# Patient Record
Sex: Male | Born: 1980 | Race: White | Hispanic: No | Smoking: Never smoker
Health system: Southern US, Community
[De-identification: ages and names within clinical notes are randomized; demographics above are authoritative.]

## PROBLEM LIST (undated history)

## (undated) DIAGNOSIS — G2581 Restless legs syndrome: Secondary | ICD-10-CM

## (undated) DIAGNOSIS — S069X9A Unspecified intracranial injury with loss of consciousness of unspecified duration, initial encounter: Secondary | ICD-10-CM

## (undated) DIAGNOSIS — J939 Pneumothorax, unspecified: Secondary | ICD-10-CM

## (undated) DIAGNOSIS — S36113A Laceration of liver, unspecified degree, initial encounter: Secondary | ICD-10-CM

## (undated) DIAGNOSIS — S069XAA Unspecified intracranial injury with loss of consciousness status unknown, initial encounter: Secondary | ICD-10-CM

## (undated) DIAGNOSIS — S36039A Unspecified laceration of spleen, initial encounter: Secondary | ICD-10-CM

## (undated) HISTORY — PX: LUNG REMOVAL, PARTIAL: SHX233

## (undated) HISTORY — PX: CLAVICLE SURGERY: SHX598

## (undated) HISTORY — PX: PLEURAL SCARIFICATION: SHX748

---
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
## 2004-08-01 ENCOUNTER — Emergency Department (HOSPITAL_COMMUNITY): Admission: EM | Admit: 2004-08-01 | Discharge: 2004-08-01 | Payer: Self-pay | Admitting: Family Medicine

## 2004-11-18 ENCOUNTER — Inpatient Hospital Stay (HOSPITAL_COMMUNITY): Admission: AC | Admit: 2004-11-18 | Discharge: 2004-11-21 | Payer: Self-pay

## 2005-07-15 ENCOUNTER — Inpatient Hospital Stay (HOSPITAL_COMMUNITY): Admission: AC | Admit: 2005-07-15 | Discharge: 2005-08-10 | Payer: Self-pay

## 2005-07-15 ENCOUNTER — Ambulatory Visit: Payer: Self-pay | Admitting: Physical Medicine & Rehabilitation

## 2005-08-02 ENCOUNTER — Encounter (INDEPENDENT_AMBULATORY_CARE_PROVIDER_SITE_OTHER): Payer: Self-pay | Admitting: Specialist

## 2005-08-02 ENCOUNTER — Encounter (INDEPENDENT_AMBULATORY_CARE_PROVIDER_SITE_OTHER): Payer: Self-pay | Admitting: *Deleted

## 2005-08-22 ENCOUNTER — Encounter: Admission: RE | Admit: 2005-08-22 | Discharge: 2005-08-22 | Payer: Self-pay | Admitting: Thoracic Surgery

## 2005-09-12 ENCOUNTER — Encounter: Admission: RE | Admit: 2005-09-12 | Discharge: 2005-09-12 | Payer: Self-pay | Admitting: Thoracic Surgery
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
## 2007-09-24 ENCOUNTER — Emergency Department (HOSPITAL_COMMUNITY): Admission: EM | Admit: 2007-09-24 | Discharge: 2007-09-24 | Payer: Self-pay | Admitting: Family Medicine

## 2007-09-27 ENCOUNTER — Emergency Department (HOSPITAL_COMMUNITY): Admission: EM | Admit: 2007-09-27 | Discharge: 2007-09-27 | Payer: Self-pay | Admitting: Emergency Medicine

## 2007-10-03 ENCOUNTER — Ambulatory Visit: Payer: Self-pay

## 2008-09-10 ENCOUNTER — Ambulatory Visit (HOSPITAL_COMMUNITY): Admission: RE | Admit: 2008-09-10 | Discharge: 2008-09-10 | Payer: Self-pay

## 2008-09-23 ENCOUNTER — Ambulatory Visit: Payer: Self-pay | Admitting: Cardiovascular Disease

## 2008-12-19 ENCOUNTER — Emergency Department (HOSPITAL_COMMUNITY): Admission: EM | Admit: 2008-12-19 | Discharge: 2008-12-19 | Payer: Self-pay | Admitting: Emergency Medicine

## 2009-02-05 ENCOUNTER — Emergency Department (HOSPITAL_COMMUNITY): Admission: EM | Admit: 2009-02-05 | Discharge: 2009-02-05 | Payer: Self-pay | Admitting: Family Medicine

## 2009-08-17 ENCOUNTER — Inpatient Hospital Stay (HOSPITAL_COMMUNITY): Admission: EM | Admit: 2009-08-17 | Discharge: 2009-08-28 | Payer: Self-pay | Admitting: Emergency Medicine

## 2009-08-25 ENCOUNTER — Ambulatory Visit: Payer: Self-pay | Admitting: Thoracic Surgery

## 2010-05-17 ENCOUNTER — Emergency Department (HOSPITAL_BASED_OUTPATIENT_CLINIC_OR_DEPARTMENT_OTHER): Admission: EM | Admit: 2010-05-17 | Discharge: 2010-05-17 | Payer: Self-pay | Admitting: Emergency Medicine

## 2010-05-17 ENCOUNTER — Ambulatory Visit: Payer: Self-pay | Admitting: Diagnostic Radiology

## 2011-03-16 LAB — DIFFERENTIAL
Basophils Absolute: 0 10*3/uL (ref 0.0–0.1)
Basophils Absolute: 0 10*3/uL (ref 0.0–0.1)
Basophils Absolute: 0 10*3/uL (ref 0.0–0.1)
Basophils Relative: 0 % (ref 0–1)
Basophils Relative: 0 % (ref 0–1)
Basophils Relative: 1 % (ref 0–1)
Basophils Relative: 1 % (ref 0–1)
Eosinophils Absolute: 0 10*3/uL (ref 0.0–0.7)
Eosinophils Absolute: 0 10*3/uL (ref 0.0–0.7)
Eosinophils Absolute: 0.1 10*3/uL (ref 0.0–0.7)
Eosinophils Absolute: 0.1 10*3/uL (ref 0.0–0.7)
Eosinophils Relative: 0 % (ref 0–5)
Eosinophils Relative: 0 % (ref 0–5)
Eosinophils Relative: 1 % (ref 0–5)
Eosinophils Relative: 1 % (ref 0–5)
Lymphocytes Relative: 12 % (ref 12–46)
Lymphocytes Relative: 16 % (ref 12–46)
Lymphocytes Relative: 3 % — ABNORMAL LOW (ref 12–46)
Lymphs Abs: 1.2 10*3/uL (ref 0.7–4.0)
Lymphs Abs: 1.7 10*3/uL (ref 0.7–4.0)
Monocytes Absolute: 1 10*3/uL (ref 0.1–1.0)
Monocytes Absolute: 1 10*3/uL (ref 0.1–1.0)
Monocytes Absolute: 1.3 10*3/uL — ABNORMAL HIGH (ref 0.1–1.0)
Monocytes Relative: 11 % (ref 3–12)
Monocytes Relative: 7 % (ref 3–12)
Monocytes Relative: 8 % (ref 3–12)
Monocytes Relative: 9 % (ref 3–12)
Neutro Abs: 10.6 10*3/uL — ABNORMAL HIGH (ref 1.7–7.7)
Neutro Abs: 8.5 10*3/uL — ABNORMAL HIGH (ref 1.7–7.7)
Neutrophils Relative %: 72 % (ref 43–77)
Neutrophils Relative %: 87 % — ABNORMAL HIGH (ref 43–77)
Neutrophils Relative %: 92 % — ABNORMAL HIGH (ref 43–77)

## 2011-03-16 LAB — CBC
HCT: 42.5 % (ref 39.0–52.0)
HCT: 44.8 % (ref 39.0–52.0)
HCT: 47.2 % (ref 39.0–52.0)
HCT: 48.7 % (ref 39.0–52.0)
HCT: 53.3 % — ABNORMAL HIGH (ref 39.0–52.0)
Hemoglobin: 14.1 g/dL (ref 13.0–17.0)
Hemoglobin: 14.2 g/dL (ref 13.0–17.0)
Hemoglobin: 17.7 g/dL — ABNORMAL HIGH (ref 13.0–17.0)
MCHC: 33.3 g/dL (ref 30.0–36.0)
MCHC: 33.4 g/dL (ref 30.0–36.0)
MCHC: 33.4 g/dL (ref 30.0–36.0)
MCHC: 33.9 g/dL (ref 30.0–36.0)
MCV: 86.4 fL (ref 78.0–100.0)
MCV: 86.5 fL (ref 78.0–100.0)
MCV: 86.8 fL (ref 78.0–100.0)
MCV: 87.1 fL (ref 78.0–100.0)
MCV: 87.5 fL (ref 78.0–100.0)
Platelets: 143 10*3/uL — ABNORMAL LOW (ref 150–400)
Platelets: 145 10*3/uL — ABNORMAL LOW (ref 150–400)
Platelets: 145 10*3/uL — ABNORMAL LOW (ref 150–400)
Platelets: 152 10*3/uL (ref 150–400)
Platelets: 160 10*3/uL (ref 150–400)
Platelets: 167 10*3/uL (ref 150–400)
Platelets: 179 10*3/uL (ref 150–400)
RBC: 4.82 MIL/uL (ref 4.22–5.81)
RBC: 5.18 MIL/uL (ref 4.22–5.81)
RBC: 5.39 MIL/uL (ref 4.22–5.81)
RBC: 6.15 MIL/uL — ABNORMAL HIGH (ref 4.22–5.81)
RDW: 15.7 % — ABNORMAL HIGH (ref 11.5–15.5)
RDW: 15.9 % — ABNORMAL HIGH (ref 11.5–15.5)
RDW: 15.9 % — ABNORMAL HIGH (ref 11.5–15.5)
RDW: 16.1 % — ABNORMAL HIGH (ref 11.5–15.5)
RDW: 16.1 % — ABNORMAL HIGH (ref 11.5–15.5)
WBC: 10.7 10*3/uL — ABNORMAL HIGH (ref 4.0–10.5)
WBC: 11.4 10*3/uL — ABNORMAL HIGH (ref 4.0–10.5)
WBC: 12.6 10*3/uL — ABNORMAL HIGH (ref 4.0–10.5)
WBC: 17.4 10*3/uL — ABNORMAL HIGH (ref 4.0–10.5)

## 2011-03-16 LAB — BASIC METABOLIC PANEL
BUN: 10 mg/dL (ref 6–23)
BUN: 5 mg/dL — ABNORMAL LOW (ref 6–23)
BUN: 8 mg/dL (ref 6–23)
Calcium: 8.8 mg/dL (ref 8.4–10.5)
Chloride: 99 mEq/L (ref 96–112)
Creatinine, Ser: 1.12 mg/dL (ref 0.4–1.5)
Creatinine, Ser: 1.25 mg/dL (ref 0.4–1.5)
GFR calc Af Amer: >60 mL/min (ref >60)
GFR calc non Af Amer: >60 mL/min (ref >60)
GFR calc non Af Amer: >60 mL/min (ref >60)
GFR calc non Af Amer: >60 mL/min (ref >60)
Glucose, Bld: 132 mg/dL — ABNORMAL HIGH (ref 70–99)
Glucose, Bld: 137 mg/dL — ABNORMAL HIGH (ref 70–99)
Potassium: 4.1 mEq/L (ref 3.5–5.1)
Potassium: 4.8 mEq/L (ref 3.5–5.1)
Sodium: 135 mEq/L (ref 135–145)

## 2011-03-16 LAB — POCT I-STAT, CHEM 8
BUN: 13 mg/dL (ref 6–23)
Chloride: 101 mEq/L (ref 96–112)
Creatinine, Ser: 1.3 mg/dL (ref 0.4–1.5)
Potassium: 3.4 mEq/L — ABNORMAL LOW (ref 3.5–5.1)
Sodium: 138 mEq/L (ref 135–145)
TCO2: 23 mmol/L (ref 0–100)

## 2011-03-16 LAB — POCT CARDIAC MARKERS
Myoglobin, poc: >500 ng/mL (ref 12–200)
Troponin i, poc: <0.05 ng/mL (ref 0.00–0.09)

## 2011-03-16 LAB — PROTIME-INR: INR: 1 (ref 0.00–1.49)

## 2011-03-19 ENCOUNTER — Emergency Department (HOSPITAL_BASED_OUTPATIENT_CLINIC_OR_DEPARTMENT_OTHER)
Admission: EM | Admit: 2011-03-19 | Discharge: 2011-03-19 | Disposition: A | Discharge status: Home / Self Care | Attending: Emergency Medicine | Admitting: Emergency Medicine

## 2011-03-19 DIAGNOSIS — J45909 Unspecified asthma, uncomplicated: Secondary | ICD-10-CM | POA: Insufficient documentation

## 2011-03-19 DIAGNOSIS — L0211 Cutaneous abscess of neck: Secondary | ICD-10-CM | POA: Insufficient documentation

## 2011-03-19 DIAGNOSIS — F341 Dysthymic disorder: Secondary | ICD-10-CM | POA: Insufficient documentation

## 2011-03-19 DIAGNOSIS — L723 Sebaceous cyst: Secondary | ICD-10-CM | POA: Insufficient documentation

## 2011-03-23 LAB — CULTURE, ROUTINE-ABSCESS: Culture: NO GROWTH
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
## 2011-04-27 NOTE — Op Note (Signed)
NAME:  [name], VESTER NO.:  [id number]   MEDICAL RECORD NO.:  [id number]          PATIENT TYPE:  INP   LOCATION:  3309                         FACILITY:  [facility]   PHYSICIAN:  Ines Bloomer, M.D. DATE OF BIRTH:  07-16-1981   DATE OF PROCEDURE:  08/02/2005  DATE OF DISCHARGE:                                 OPERATIVE REPORT   PREOPERATIVE DIAGNOSIS:  Status post rib fractures with right hemothorax and  multiple other fractures with probable empyema.   POSTOPERATIVE DIAGNOSES:  1.  Status post rib fractures with right hemothorax and multiple other      fractures with probable empyema.  2.  Empyema right chest secondary to hemothorax.   OPERATION PERFORMED:  1.  Right VATs.  2.  Right thoracotomy with decortication and drainage of empyema.   SURGEON:  Ines Bloomer, M.D.   FIRST ASSISTANT:  Pecola Leisure, PA.   ANESTHESIA:  General.   After percutaneous insertion of all monitoring lines, the patient turned to  the right lateral thoracotomy position, was prepped and draped in the usual  sterile manner.  Two trocar sites were made in the anterior.  A dual-lumen  tube was inserted, and two trocar sites were made in the anterior and  posterior axillary line.  At the 7th intercostal space, two trocars were  inserted.  Adhesions were taken down and a 0 degree scope was inserted, and  the patient had a full blown empyema.  Using Keyser ring forceps, the lung  was freed up medially, laterally, superiorly, and inferiorly removing all  kinds of exudate.  Sent in for culture as well as sending the fluid for  culture.  After this had been done, a third trocar site was made in the  sixth intercostal space at the mid axillary line and putting the scope up  there we were able to remove the exudate from the right costophrenic angle  and partially freeing up the right lower lobe.  Obviously, there was too  much exudate and scarring on the right lower lobe that we had  to do a mini  thoracotomy.  The sixth intercostal space incision was enlarged to  approximately 4 or 5 cm.  Lattismus was split, and a small Tuffier was  inserted, and the right lower lobe was then freed up by sharp and blunt  dissection removing the exudate on it.  The right middle lobe was also freed  up and then the anterior segment of the upper lobe was freed up.  The major  fissure was opened removing all exudate and fluid out of the  fissure.  Finally, attention was turned to the middle lobe and it was grasped with a  forceps and the peel was removed using Kitner's and Russian forceps as well  as DeBakey forceps.  After the middle lobe being decorticated, the anterior  segment of the upper lobe was likewise decorticated, and then attention was  turned to the lower lobe which had to do a complete decortication.  Decortication of the whole right lower lobe removing the thickened peel off  of that.  After that had been done, three chest tubes were placed; two  through the trocar sites, and one through another incision.  The chest was  closed completely debrided and the lung was re-expanded.  Several tears in  the lung were sutured with 3-0 Vicryl.  The On-  Q  catheter was placed subpleurally in the usual fashion.  Marcaine block  was done in the usual fashion.  The chest was closed with two pericostals,  #1 Vicryl in the muscle layer, 2-0 Vicryl in the subcutaneous tissue,  and 3-  0 Vicryl subcuticular stitch.  The patient tolerated the procedure well and  returned to the recovery room in stable condition.           ______________________________  Ines Bloomer, M.D.     DPB/MEDQ  D:  08/02/2005  T:  08/03/2005  Job:  [id number]   cc:   Cherylynn Ridges, M.D.  [address]  [place]  [place] [address]

## 2013-07-28 ENCOUNTER — Emergency Department (HOSPITAL_COMMUNITY)

## 2013-07-28 ENCOUNTER — Emergency Department (HOSPITAL_COMMUNITY)
Admission: EM | Admit: 2013-07-28 | Discharge: 2013-07-28 | Disposition: A | Discharge status: Home / Self Care | Attending: Emergency Medicine | Admitting: Emergency Medicine

## 2013-07-28 ENCOUNTER — Encounter (HOSPITAL_COMMUNITY): Payer: Self-pay | Admitting: Emergency Medicine

## 2013-07-28 DIAGNOSIS — N453 Epididymo-orchitis: Secondary | ICD-10-CM | POA: Insufficient documentation

## 2013-07-28 DIAGNOSIS — N451 Epididymitis: Secondary | ICD-10-CM

## 2013-07-28 DIAGNOSIS — Z880 Allergy status to penicillin: Secondary | ICD-10-CM | POA: Insufficient documentation

## 2013-07-28 DIAGNOSIS — IMO0002 Reserved for concepts with insufficient information to code with codable children: Secondary | ICD-10-CM | POA: Insufficient documentation

## 2013-07-28 LAB — URINALYSIS, ROUTINE W REFLEX MICROSCOPIC
Ketones, ur: NEGATIVE mg/dL
Leukocytes, UA: NEGATIVE
Nitrite: NEGATIVE
Protein, ur: NEGATIVE mg/dL

## 2013-07-28 MED ORDER — ONDANSETRON HCL 4 MG PO TABS: 4.0000 mg | ORAL_TABLET | Freq: Four times a day (QID) | ORAL | Status: DC

## 2013-07-28 MED ORDER — CEFTRIAXONE SODIUM 250 MG IJ SOLR
250.0000 mg | Freq: Once | INTRAMUSCULAR | Status: AC
  Administered 2013-07-28: 250 mg via INTRAMUSCULAR
  Filled 2013-07-28: qty 250

## 2013-07-28 MED ORDER — AZITHROMYCIN 250 MG PO TABS
1000.0000 mg | ORAL_TABLET | Freq: Once | ORAL | Status: AC
  Administered 2013-07-28: 1000 mg via ORAL
  Filled 2013-07-28: qty 4

## 2013-07-28 MED ORDER — OXYCODONE-ACETAMINOPHEN 5-325 MG PO TABS: 2.0000 | ORAL_TABLET | Freq: Four times a day (QID) | ORAL | Status: DC | PRN

## 2013-07-28 MED ORDER — OXYCODONE-ACETAMINOPHEN 5-325 MG PO TABS
2.0000 | ORAL_TABLET | Freq: Once | ORAL | Status: AC
  Administered 2013-07-28: 2 via ORAL
  Filled 2013-07-28: qty 2

## 2013-07-28 MED ORDER — ONDANSETRON 4 MG PO TBDP
4.0000 mg | ORAL_TABLET | Freq: Once | ORAL | Status: AC
  Administered 2013-07-28: 4 mg via ORAL
  Filled 2013-07-28: qty 1

## 2013-07-28 MED ORDER — DOXYCYCLINE HYCLATE 50 MG PO CAPS: 100.0000 mg | ORAL_CAPSULE | Freq: Two times a day (BID) | ORAL | Status: DC

## 2013-07-28 NOTE — ED Provider Notes (Signed)
TIME SEEN: 5:31 PM  CHIEF COMPLAINT: Testicular pain, bilateral groin pain  HPI: Patient is a 32 y.o. Caucasian male with history of chlamydia who presents emergency department with one month of bilateral testicular and groin pain. He reports that he feels he was exposed to sexual transmitted disease but has had urine tests which were negative for gonorrhea and Chlamydia at the health Department recently. He denies any penile discharge. No dysuria or hematuria. He does have pain with bowel movements and feels that history and is weaker than normal. He has been on doxycycline for 10 days without any relief of symptoms. He states when he works out, he does feel scrotal swelling which resolved spontaneously.  ROS: See HPI Constitutional: no fever or chills Eyes: no drainage  ENT: no runny nose   Cardiovascular:  no chest pain  Resp: no SOB  GI: no vomiting GU: no dysuria Integumentary: no rash  Allergy: no hives  Musculoskeletal: no leg swelling  Neurological: no slurred speech ROS otherwise negative  PAST MEDICAL HISTORY/PAST SURGICAL HISTORY:  History reviewed. No pertinent past medical history.  MEDICATIONS:  Prior to Admission medications   Medication Sig Start Date End Date Taking? Authorizing Provider  fluticasone (FLONASE) 50 MCG/ACT nasal spray Place 2 sprays into the nose daily.   Yes Historical Provider, MD    ALLERGIES:  Allergies  Allergen Reactions  . Amoxicillin     childhood    SOCIAL HISTORY:  History  Substance Use Topics  . Smoking status: Never Smoker   . Smokeless tobacco: Not on file  . Alcohol Use: No    FAMILY HISTORY: No family history on file.  EXAM: BP 122/75  Pulse 73  Temp(Src) 98.7 F (37.1 C)  Resp 18  Ht 6' (1.829 m)  Wt 194 lb (87.998 kg)  BMI 26.31 kg/m2  SpO2 96% CONSTITUTIONAL: Alert and oriented and responds appropriately to questions. Well-appearing; well-nourished HEAD: Normocephalic EYES: Conjunctivae clear, PERRL ENT:  normal nose; no rhinorrhea; moist mucous membranes; pharynx without lesions noted NECK: Supple, no meningismus, no LAD  CARD: RRR; S1 and S2 appreciated; no murmurs, no clicks, no rubs, no gallops RESP: Normal chest excursion without splinting or tachypnea; breath sounds clear and equal bilaterally; no wheezes, no rhonchi, no rales,  ABD/GI: Normal bowel sounds; non-distended; soft, non-tender, no rebound, no guarding GU:  No external genital lesions, no penile discharge, officials are tender to palpation bilaterally without swelling or masses, no scrotal swelling, no inguinal lymphadenopathy or masses, no hernia appreciated, 2+ femoral pulses bilaterally, cremasteric reflex intact RECTAL:  Patient has nonthrombosed and nonbleeding external hemorrhoids, mildly enlarged prostate and is nontender to palpation, no gross blood, normal tone BACK:  The back appears normal and is non-tender to palpation, there is no CVA tenderness EXT: Normal ROM in all joints; non-tender to palpation; no edema; normal capillary refill; no cyanosis    SKIN: Normal color for age and race; warm NEURO: Moves all extremities equally PSYCH: The patient's mood and manner are appropriate. Grooming and personal hygiene are appropriate.  MEDICAL DECISION MAKING: Patient with bilateral testicular pain and concern for STD exposure. Will empirically treat for gonorrhea and Chlamydia. He has been on antibiotics for which reason for prostatitis without any relief. Will obtain ultrasound of his testicles to evaluate for possible epididymitis. Will check urinalysis and urine culture. Anticipate discharge home with outpatient followup. No hernia appreciated today.  ED PROGRESS: Pt has right-sided epididymitis. No torsion seen on ultrasound. Patient has received ceftriaxone 250 mg  IM. We'll discharge him on doxycycline 100 mg twice daily for 10 days. We'll discharge with pain medication and outpatient followup. Given customary usual return  precautions. Patient verbalizes understanding is comfortable with this plan.  Layla Maw Lamarco Gudiel, DO 07/29/13 0011

## 2013-07-28 NOTE — ED Notes (Signed)
Pt here for c/o groin x2 monts. Pt stated that he has been treated for STD an has been neg both time. Sent by Urgent Care r/o hernia

## 2013-07-29 LAB — URINE CULTURE

## 2015-02-19 ENCOUNTER — Emergency Department (HOSPITAL_COMMUNITY)
Admission: EM | Admit: 2015-02-19 | Discharge: 2015-02-19 | Disposition: A | Discharge status: Home / Self Care | Attending: Emergency Medicine | Admitting: Emergency Medicine

## 2015-02-19 ENCOUNTER — Emergency Department (HOSPITAL_COMMUNITY)

## 2015-02-19 ENCOUNTER — Encounter (HOSPITAL_COMMUNITY): Payer: Self-pay | Admitting: Emergency Medicine

## 2015-02-19 DIAGNOSIS — Z880 Allergy status to penicillin: Secondary | ICD-10-CM | POA: Insufficient documentation

## 2015-02-19 DIAGNOSIS — R300 Dysuria: Secondary | ICD-10-CM | POA: Diagnosis present

## 2015-02-19 DIAGNOSIS — Z792 Long term (current) use of antibiotics: Secondary | ICD-10-CM | POA: Diagnosis not present

## 2015-02-19 DIAGNOSIS — R109 Unspecified abdominal pain: Secondary | ICD-10-CM | POA: Diagnosis not present

## 2015-02-19 DIAGNOSIS — Z7951 Long term (current) use of inhaled steroids: Secondary | ICD-10-CM | POA: Diagnosis not present

## 2015-02-19 DIAGNOSIS — Z79899 Other long term (current) drug therapy: Secondary | ICD-10-CM | POA: Diagnosis not present

## 2015-02-19 LAB — URINALYSIS, ROUTINE W REFLEX MICROSCOPIC
Bilirubin Urine: NEGATIVE
Glucose, UA: NEGATIVE mg/dL
HGB URINE DIPSTICK: NEGATIVE
KETONES UR: NEGATIVE mg/dL
Leukocytes, UA: NEGATIVE
Nitrite: NEGATIVE
PH: 6.5 (ref 5.0–8.0)
PROTEIN: NEGATIVE mg/dL
SPECIFIC GRAVITY, URINE: 1.022 (ref 1.005–1.030)
UROBILINOGEN UA: 1 mg/dL (ref 0.0–1.0)

## 2015-02-19 MED ORDER — IBUPROFEN 800 MG PO TABS: 800.0000 mg | ORAL_TABLET | Freq: Three times a day (TID) | ORAL | Status: DC

## 2015-02-19 MED ORDER — HYDROCODONE-ACETAMINOPHEN 5-325 MG PO TABS: 2.0000 | ORAL_TABLET | ORAL | Status: DC | PRN

## 2015-02-19 NOTE — ED Notes (Signed)
Pt reports right lower flank pain radiating to groin starting 2 days ago. No hx kidney stones. Has also had associated dysuria and "less urine than usual." Has not taken pain medications today. Pt is in NAD.
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
## 2015-02-19 NOTE — Discharge Instructions (Signed)
Sciatica  Sciatica is pain, weakness, numbness, or tingling along the path of the sciatic nerve. The nerve starts in the lower back and runs down the back of each leg. The nerve controls the muscles in the lower leg and in the back of the knee, while also providing sensation to the back of the thigh, lower leg, and the sole of your foot. Sciatica is a symptom of another medical condition. For instance, nerve damage or certain conditions, such as a herniated disk or bone spur on the spine, pinch or put pressure on the sciatic nerve. This causes the pain, weakness, or other sensations normally associated with sciatica. Generally, sciatica only affects one side of the body.  CAUSES    Herniated or slipped disc.   Degenerative disk disease.   A pain disorder involving the narrow muscle in the buttocks (piriformis syndrome).   Pelvic injury or fracture.   Pregnancy.   Tumor (rare).  SYMPTOMS   Symptoms can vary from mild to very severe. The symptoms usually travel from the low back to the buttocks and down the back of the leg. Symptoms can include:   Mild tingling or dull aches in the lower back, leg, or hip.   Numbness in the back of the calf or sole of the foot.   Burning sensations in the lower back, leg, or hip.   Sharp pains in the lower back, leg, or hip.   Leg weakness.   Severe back pain inhibiting movement.  These symptoms may get worse with coughing, sneezing, laughing, or prolonged sitting or standing. Also, being overweight may worsen symptoms.  DIAGNOSIS   Your caregiver will perform a physical exam to look for common symptoms of sciatica. He or she may ask you to do certain movements or activities that would trigger sciatic nerve pain. Other tests may be performed to find the cause of the sciatica. These may include:   Blood tests.   X-rays.   Imaging tests, such as an MRI or CT scan.  TREATMENT   Treatment is directed at the cause of the sciatic pain. Sometimes, treatment is not necessary  and the pain and discomfort goes away on its own. If treatment is needed, your caregiver may suggest:   Over-the-counter medicines to relieve pain.   Prescription medicines, such as anti-inflammatory medicine, muscle relaxants, or narcotics.   Applying heat or ice to the painful area.   Steroid injections to lessen pain, irritation, and inflammation around the nerve.   Reducing activity during periods of pain.   Exercising and stretching to strengthen your abdomen and improve flexibility of your spine. Your caregiver may suggest losing weight if the extra weight makes the back pain worse.   Physical therapy.   Surgery to eliminate what is pressing or pinching the nerve, such as a bone spur or part of a herniated disk.  HOME CARE INSTRUCTIONS    Only take over-the-counter or prescription medicines for pain or discomfort as directed by your caregiver.   Apply ice to the affected area for 20 minutes, 3-4 times a day for the first 48-72 hours. Then try heat in the same way.   Exercise, stretch, or perform your usual activities if these do not aggravate your pain.   Attend physical therapy sessions as directed by your caregiver.   Keep all follow-up appointments as directed by your caregiver.   Do not wear high heels or shoes that do not provide proper support.   Check your mattress to see if   it is too soft. A firm mattress may lessen your pain and discomfort.  SEEK IMMEDIATE MEDICAL CARE IF:    You lose control of your bowel or bladder (incontinence).   You have increasing weakness in the lower back, pelvis, buttocks, or legs.   You have redness or swelling of your back.   You have a burning sensation when you urinate.   You have pain that gets worse when you lie down or awakens you at night.   Your pain is worse than you have experienced in the past.   Your pain is lasting longer than 4 weeks.   You are suddenly losing weight without reason.  MAKE SURE YOU:   Understand these  instructions.   Will watch your condition.   Will get help right away if you are not doing well or get worse.  Document Released: 11/20/2001 Document Revised: 05/27/2012 Document Reviewed: 04/06/2012  Exit[employer] Patient Information 2015 [employer]. This information is not intended to replace advice given to you by your health care provider. Make sure you discuss any questions you have with your health care provider.  Back Pain, Adult  Low back pain is very common. About 1 in 5 people have back pain.The cause of low back pain is rarely dangerous. The pain often gets better over time.About half of people with a sudden onset of back pain feel better in just 2 weeks. About 8 in 10 people feel better by 6 weeks.   CAUSES  Some common causes of back pain include:   Strain of the muscles or ligaments supporting the spine.   Wear and tear (degeneration) of the spinal discs.   Arthritis.   Direct injury to the back.  DIAGNOSIS  Most of the time, the direct cause of low back pain is not known.However, back pain can be treated effectively even when the exact cause of the pain is unknown.Answering your caregiver's questions about your overall health and symptoms is one of the most accurate ways to make sure the cause of your pain is not dangerous. If your caregiver needs more information, he or she may order lab work or imaging tests (X-rays or MRIs).However, even if imaging tests show changes in your back, this usually does not require surgery.  HOME CARE INSTRUCTIONS  For many people, back pain returns.Since low back pain is rarely dangerous, it is often a condition that people can learn to manageon their own.    Remain active. It is stressful on the back to sit or stand in one place. Do not sit, drive, or stand in one place for more than 30 minutes at a time. Take short walks on level surfaces as soon as pain allows.Try to increase the length of time you walk each day.   Do not stay in bed.Resting more  than 1 or 2 days can delay your recovery.   Do not avoid exercise or work.Your body is made to move.It is not dangerous to be active, even though your back may hurt.Your back will likely heal faster if you return to being active before your pain is gone.   Pay attention to your body when you bend and lift. Many people have less discomfortwhen lifting if they bend their knees, keep the load close to their bodies,and avoid twisting. Often, the most comfortable positions are those that put less stress on your recovering back.   Find a comfortable position to sleep. Use a firm mattress and lie on your side with your knees   slightly bent. If you lie on your back, put a pillow under your knees.   Only take over-the-counter or prescription medicines as directed by your caregiver. Over-the-counter medicines to reduce pain and inflammation are often the most helpful.Your caregiver may prescribe muscle relaxant drugs.These medicines help dull your pain so you can more quickly return to your normal activities and healthy exercise.   Put ice on the injured area.   Put ice in a plastic bag.   Place a towel between your skin and the bag.   Leave the ice on for 15-20 minutes, 03-04 times a day for the first 2 to 3 days. After that, ice and heat may be alternated to reduce pain and spasms.   Ask your caregiver about trying back exercises and gentle massage. This may be of some benefit.   Avoid feeling anxious or stressed.Stress increases muscle tension and can worsen back pain.It is important to recognize when you are anxious or stressed and learn ways to manage it.Exercise is a great option.  SEEK MEDICAL CARE IF:   You have pain that is not relieved with rest or medicine.   You have pain that does not improve in 1 week.   You have new symptoms.   You are generally not feeling well.  SEEK IMMEDIATE MEDICAL CARE IF:    You have pain that radiates from your back into your legs.   You develop new bowel or  bladder control problems.   You have unusual weakness or numbness in your arms or legs.   You develop nausea or vomiting.   You develop abdominal pain.   You feel faint.  Document Released: 11/26/2005 Document Revised: 05/27/2012 Document Reviewed: 03/30/2014  [employer] Patient Information 2015 [employer]. This information is not intended to replace advice given to you by your health care provider. Make sure you discuss any questions you have with your health care provider.

## 2015-12-13 ENCOUNTER — Emergency Department (HOSPITAL_COMMUNITY)

## 2015-12-13 ENCOUNTER — Encounter (HOSPITAL_COMMUNITY): Payer: Self-pay | Admitting: Emergency Medicine

## 2015-12-13 ENCOUNTER — Emergency Department (HOSPITAL_COMMUNITY)
Admission: EM | Admit: 2015-12-13 | Discharge: 2015-12-13 | Disposition: A | Discharge status: Home / Self Care | Attending: Emergency Medicine | Admitting: Emergency Medicine

## 2015-12-13 DIAGNOSIS — G2581 Restless legs syndrome: Secondary | ICD-10-CM | POA: Insufficient documentation

## 2015-12-13 DIAGNOSIS — Z79899 Other long term (current) drug therapy: Secondary | ICD-10-CM | POA: Diagnosis not present

## 2015-12-13 DIAGNOSIS — Z880 Allergy status to penicillin: Secondary | ICD-10-CM | POA: Insufficient documentation

## 2015-12-13 DIAGNOSIS — Z87828 Personal history of other (healed) physical injury and trauma: Secondary | ICD-10-CM | POA: Diagnosis not present

## 2015-12-13 DIAGNOSIS — J069 Acute upper respiratory infection, unspecified: Secondary | ICD-10-CM | POA: Insufficient documentation

## 2015-12-13 DIAGNOSIS — N410 Acute prostatitis: Secondary | ICD-10-CM | POA: Insufficient documentation

## 2015-12-13 DIAGNOSIS — Z87820 Personal history of traumatic brain injury: Secondary | ICD-10-CM | POA: Insufficient documentation

## 2015-12-13 DIAGNOSIS — R05 Cough: Secondary | ICD-10-CM | POA: Diagnosis present

## 2015-12-13 DIAGNOSIS — N419 Inflammatory disease of prostate, unspecified: Secondary | ICD-10-CM

## 2015-12-13 HISTORY — DX: Unspecified intracranial injury with loss of consciousness of unspecified duration, initial encounter: S06.9X9A

## 2015-12-13 HISTORY — DX: Restless legs syndrome: G25.81

## 2015-12-13 HISTORY — DX: Laceration of liver, unspecified degree, initial encounter: S36.113A

## 2015-12-13 HISTORY — DX: Unspecified laceration of spleen, initial encounter: S36.039A

## 2015-12-13 HISTORY — DX: Unspecified intracranial injury with loss of consciousness status unknown, initial encounter: S06.9XAA

## 2015-12-13 HISTORY — DX: Pneumothorax, unspecified: J93.9

## 2015-12-13 LAB — BASIC METABOLIC PANEL
ANION GAP: 9 (ref 5–15)
BUN: 16 mg/dL (ref 6–20)
CO2: 29 mmol/L (ref 22–32)
Calcium: 9.7 mg/dL (ref 8.9–10.3)
Chloride: 104 mmol/L (ref 101–111)
Creatinine, Ser: 1.17 mg/dL (ref 0.61–1.24)
GFR calc Af Amer: >60 mL/min (ref >60)
GFR calc non Af Amer: >60 mL/min (ref >60)
GLUCOSE: 116 mg/dL — AB (ref 65–99)
POTASSIUM: 4 mmol/L (ref 3.5–5.1)
Sodium: 142 mmol/L (ref 135–145)

## 2015-12-13 LAB — CBC
HEMATOCRIT: 48.1 % (ref 39.0–52.0)
HEMOGLOBIN: 16.1 g/dL (ref 13.0–17.0)
MCH: 28.5 pg (ref 26.0–34.0)
MCHC: 33.5 g/dL (ref 30.0–36.0)
MCV: 85.1 fL (ref 78.0–100.0)
Platelets: 141 10*3/uL — ABNORMAL LOW (ref 150–400)
RBC: 5.65 MIL/uL (ref 4.22–5.81)
RDW: 13.4 % (ref 11.5–15.5)
WBC: 5.5 10*3/uL (ref 4.0–10.5)

## 2015-12-13 LAB — URINALYSIS, ROUTINE W REFLEX MICROSCOPIC
Bilirubin Urine: NEGATIVE
GLUCOSE, UA: NEGATIVE mg/dL
HGB URINE DIPSTICK: NEGATIVE
KETONES UR: NEGATIVE mg/dL
LEUKOCYTES UA: NEGATIVE
Nitrite: NEGATIVE
PH: 6.5 (ref 5.0–8.0)
Protein, ur: NEGATIVE mg/dL
Specific Gravity, Urine: 1.009 (ref 1.005–1.030)

## 2015-12-13 MED ORDER — CIPROFLOXACIN HCL 500 MG PO TABS: 500.0000 mg | ORAL_TABLET | Freq: Two times a day (BID) | ORAL | Status: DC

## 2015-12-13 NOTE — Discharge Instructions (Signed)

## 2015-12-13 NOTE — ED Notes (Signed)
Ambulated pt with portable O2, sats were 91%, provider notified
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
## 2015-12-13 NOTE — ED Notes (Signed)
MD at bedside. 
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
## 2015-12-13 NOTE — ED Notes (Signed)
Bladder scan read 113

## 2017-07-10 ENCOUNTER — Emergency Department (HOSPITAL_COMMUNITY)

## 2017-07-10 ENCOUNTER — Emergency Department (HOSPITAL_COMMUNITY)
Admission: EM | Admit: 2017-07-10 | Discharge: 2017-07-10 | Disposition: A | Discharge status: Home / Self Care | Attending: Emergency Medicine | Admitting: Emergency Medicine

## 2017-07-10 ENCOUNTER — Encounter (HOSPITAL_COMMUNITY): Payer: Self-pay | Admitting: Emergency Medicine

## 2017-07-10 DIAGNOSIS — R1011 Right upper quadrant pain: Secondary | ICD-10-CM | POA: Insufficient documentation

## 2017-07-10 DIAGNOSIS — R109 Unspecified abdominal pain: Secondary | ICD-10-CM

## 2017-07-10 DIAGNOSIS — Z79899 Other long term (current) drug therapy: Secondary | ICD-10-CM | POA: Diagnosis not present

## 2017-07-10 LAB — CBC WITH DIFFERENTIAL/PLATELET
BASOS ABS: 0.1 10*3/uL (ref 0.0–0.1)
Basophils Relative: 1 %
EOS ABS: 0.2 10*3/uL (ref 0.0–0.7)
Eosinophils Relative: 3 %
HCT: 43.4 % (ref 39.0–52.0)
Hemoglobin: 14.7 g/dL (ref 13.0–17.0)
LYMPHS ABS: 2.3 10*3/uL (ref 0.7–4.0)
Lymphocytes Relative: 36 %
MCH: 27.9 pg (ref 26.0–34.0)
MCHC: 33.9 g/dL (ref 30.0–36.0)
MCV: 82.4 fL (ref 78.0–100.0)
MONOS PCT: 7 %
Monocytes Absolute: 0.4 10*3/uL (ref 0.1–1.0)
Neutro Abs: 3.4 10*3/uL (ref 1.7–7.7)
Neutrophils Relative %: 53 %
PLATELETS: 125 10*3/uL — AB (ref 150–400)
RBC: 5.27 MIL/uL (ref 4.22–5.81)
RDW: 13.7 % (ref 11.5–15.5)
WBC: 6.4 10*3/uL (ref 4.0–10.5)

## 2017-07-10 LAB — COMPREHENSIVE METABOLIC PANEL
ALBUMIN: 4.5 g/dL (ref 3.5–5.0)
ALT: 13 U/L — ABNORMAL LOW (ref 17–63)
AST: 15 U/L (ref 15–41)
Alkaline Phosphatase: 40 U/L (ref 38–126)
Anion gap: 8 (ref 5–15)
BILIRUBIN TOTAL: 1.8 mg/dL — AB (ref 0.3–1.2)
BUN: 13 mg/dL (ref 6–20)
CO2: 28 mmol/L (ref 22–32)
Calcium: 9.2 mg/dL (ref 8.9–10.3)
Chloride: 104 mmol/L (ref 101–111)
Creatinine, Ser: 1.11 mg/dL (ref 0.61–1.24)
GFR calc Af Amer: >60 mL/min (ref >60)
Glucose, Bld: 118 mg/dL — ABNORMAL HIGH (ref 65–99)
POTASSIUM: 4 mmol/L (ref 3.5–5.1)
Sodium: 140 mmol/L (ref 135–145)
TOTAL PROTEIN: 7.1 g/dL (ref 6.5–8.1)

## 2017-07-10 LAB — URINALYSIS, ROUTINE W REFLEX MICROSCOPIC
BILIRUBIN URINE: NEGATIVE
Glucose, UA: NEGATIVE mg/dL
Hgb urine dipstick: NEGATIVE
Ketones, ur: NEGATIVE mg/dL
Leukocytes, UA: NEGATIVE
Nitrite: NEGATIVE
PH: 6 (ref 5.0–8.0)
Protein, ur: NEGATIVE mg/dL
SPECIFIC GRAVITY, URINE: 1.006 (ref 1.005–1.030)

## 2017-07-10 LAB — LIPASE, BLOOD: Lipase: 36 U/L (ref 11–51)

## 2017-07-10 MED ORDER — MORPHINE SULFATE (PF) 2 MG/ML IV SOLN
4.0000 mg | Freq: Once | INTRAVENOUS | Status: AC
  Administered 2017-07-10: 4 mg via INTRAVENOUS
  Filled 2017-07-10: qty 2

## 2017-07-10 MED ORDER — IOPAMIDOL (ISOVUE-300) INJECTION 61%
100.0000 mL | Freq: Once | INTRAVENOUS | Status: AC | PRN
  Administered 2017-07-10: 100 mL via INTRAVENOUS

## 2017-07-10 MED ORDER — HYDROCODONE-ACETAMINOPHEN 5-325 MG PO TABS: 1.0000 | ORAL_TABLET | ORAL | 0 refills | Status: DC | PRN

## 2017-07-10 MED ORDER — IOPAMIDOL (ISOVUE-300) INJECTION 61%
INTRAVENOUS | Status: AC
  Filled 2017-07-10: qty 100

## 2017-07-10 MED ORDER — SODIUM CHLORIDE 0.9 % IV BOLUS (SEPSIS)
1000.0000 mL | Freq: Once | INTRAVENOUS | Status: AC
  Administered 2017-07-10: 1000 mL via INTRAVENOUS

## 2017-07-10 NOTE — ED Triage Notes (Signed)
Pt states that he has had R sided flank pain since Friday. Sent over by [facility] on Friday but did not come until today. Alert and oriented.
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
## 2017-07-10 NOTE — ED Notes (Signed)
Pt ambulatory and independent at discharge.  Verbalized understanding of discharge instructions 

## 2019-02-01 ENCOUNTER — Emergency Department (HOSPITAL_COMMUNITY)

## 2019-02-01 ENCOUNTER — Other Ambulatory Visit: Payer: Self-pay

## 2019-02-01 ENCOUNTER — Emergency Department (HOSPITAL_COMMUNITY)
Admission: EM | Admit: 2019-02-01 | Discharge: 2019-02-01 | Disposition: A | Discharge status: Home / Self Care | Attending: Emergency Medicine | Admitting: Emergency Medicine

## 2019-02-01 DIAGNOSIS — R0789 Other chest pain: Secondary | ICD-10-CM | POA: Insufficient documentation

## 2019-02-01 DIAGNOSIS — Z79899 Other long term (current) drug therapy: Secondary | ICD-10-CM | POA: Insufficient documentation

## 2019-02-01 DIAGNOSIS — R509 Fever, unspecified: Secondary | ICD-10-CM | POA: Insufficient documentation

## 2019-02-01 LAB — URINALYSIS, ROUTINE W REFLEX MICROSCOPIC
Bilirubin Urine: NEGATIVE
GLUCOSE, UA: NEGATIVE mg/dL
Hgb urine dipstick: NEGATIVE
KETONES UR: NEGATIVE mg/dL
LEUKOCYTE UA: NEGATIVE
Nitrite: NEGATIVE
PH: 5 (ref 5.0–8.0)
Protein, ur: NEGATIVE mg/dL
SPECIFIC GRAVITY, URINE: 1.024 (ref 1.005–1.030)

## 2019-02-01 LAB — COMPREHENSIVE METABOLIC PANEL
ALBUMIN: 4.2 g/dL (ref 3.5–5.0)
ALK PHOS: 63 U/L (ref 38–126)
ALT: 68 U/L — AB (ref 0–44)
AST: 31 U/L (ref 15–41)
Anion gap: 8 (ref 5–15)
BILIRUBIN TOTAL: 1.2 mg/dL (ref 0.3–1.2)
BUN: 11 mg/dL (ref 6–20)
CALCIUM: 9 mg/dL (ref 8.9–10.3)
CO2: 26 mmol/L (ref 22–32)
Chloride: 105 mmol/L (ref 98–111)
Creatinine, Ser: 1.11 mg/dL (ref 0.61–1.24)
GFR calc Af Amer: >60 mL/min (ref >60)
GFR calc non Af Amer: >60 mL/min (ref >60)
GLUCOSE: 89 mg/dL (ref 70–99)
POTASSIUM: 4.2 mmol/L (ref 3.5–5.1)
Sodium: 139 mmol/L (ref 135–145)
TOTAL PROTEIN: 7.4 g/dL (ref 6.5–8.1)

## 2019-02-01 LAB — CBC WITH DIFFERENTIAL/PLATELET
Abs Immature Granulocytes: 0.01 10*3/uL (ref 0.00–0.07)
BASOS PCT: 2 %
Basophils Absolute: 0.1 10*3/uL (ref 0.0–0.1)
Eosinophils Absolute: 0.1 10*3/uL (ref 0.0–0.5)
Eosinophils Relative: 2 %
HEMATOCRIT: 51.2 % (ref 39.0–52.0)
Hemoglobin: 16.2 g/dL (ref 13.0–17.0)
IMMATURE GRANULOCYTES: 0 %
Lymphocytes Relative: 48 %
Lymphs Abs: 3.4 10*3/uL (ref 0.7–4.0)
MCH: 27.4 pg (ref 26.0–34.0)
MCHC: 31.6 g/dL (ref 30.0–36.0)
MCV: 86.5 fL (ref 80.0–100.0)
MONO ABS: 0.6 10*3/uL (ref 0.1–1.0)
MONOS PCT: 8 %
NEUTROS ABS: 2.7 10*3/uL (ref 1.7–7.7)
Neutrophils Relative %: 40 %
Platelets: 185 10*3/uL (ref 150–400)
RBC: 5.92 MIL/uL — ABNORMAL HIGH (ref 4.22–5.81)
RDW: 13.6 % (ref 11.5–15.5)
WBC Morphology: ABNORMAL
WBC: 6.9 10*3/uL (ref 4.0–10.5)
nRBC: 0 % (ref 0.0–0.2)

## 2019-02-01 LAB — I-STAT TROPONIN, ED
TROPONIN I, POC: 0 ng/mL (ref 0.00–0.08)
Troponin i, poc: 0.01 ng/mL (ref 0.00–0.08)

## 2019-02-01 LAB — MONONUCLEOSIS SCREEN: MONO SCREEN: NEGATIVE

## 2019-02-01 LAB — INFLUENZA PANEL BY PCR (TYPE A & B)
INFLAPCR: NEGATIVE
INFLBPCR: NEGATIVE

## 2019-02-01 MED ORDER — IBUPROFEN 200 MG PO TABS
600.0000 mg | ORAL_TABLET | Freq: Once | ORAL | Status: AC
  Administered 2019-02-01: 600 mg via ORAL
  Filled 2019-02-01: qty 3
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
## 2019-02-01 NOTE — ED Triage Notes (Signed)
Pt c/o generalized body aches, pt also with headache x several weeks. Pt states he will experience fevers and extreme fatigue at the end of the day. Saw [facility] 2 weeks ago and negative for flu but given tamiflu and no change in symptoms.

## 2019-02-01 NOTE — Discharge Instructions (Addendum)
You have been seen today for fever, body aches, chest pain, and headaches. Please read and follow all provided instructions.   1. Medications: usual home medications, continue to take ibuprofen for symptoms 2. Treatment: rest, drink plenty of fluids 3. Follow Up: Please follow up with your primary doctor in 2 days for discussion of your diagnoses and further evaluation after today's visit; if you do not have a primary care doctor use the resource guide provided to find one; Please return to the ER for any new or worsening symptoms. Please obtain all of your results from medical records or have your doctors office obtain the results - share them with your doctor - you should be seen at your doctors office. Call today to arrange your follow up.   You should return to the ER if you develop severe or worsening symptoms.   Emergency Department Resource Guide 1) Find a Doctor and Pay Out of Pocket Although you won't have to find out who is covered by your insurance plan, it is a good idea to ask around and get recommendations. You will then need to call the office and see if the doctor you have chosen will accept you as a new patient and what types of options they offer for patients who are self-pay. Some doctors offer discounts or will set up payment plans for their patients who do not have insurance, but you will need to ask so you aren't surprised when you get to your appointment.  2) Contact Your Local Health Department Not all health departments have doctors that can see patients for sick visits, but many do, so it is worth a call to see if yours does. If you don't know where your local health department is, you can check in your phone book. The CDC also has a tool to help you locate your state's health department, and many state websites also have listings of all of their local health departments.  3) Find a Walk-in Clinic If your illness is not likely to be very severe or complicated, you may want  to try a walk in clinic. These are popping up all over the country in pharmacies, drugstores, and shopping centers. They're usually staffed by nurse practitioners or physician assistants that have been trained to treat common illnesses and complaints. They're usually fairly quick and inexpensive. However, if you have serious medical issues or chronic medical problems, these are probably not your best option.  No Primary Care Doctor: Call Health Connect at  [phone] - they can help you locate a primary care doctor that  accepts your insurance, provides certain services, etc. Physician Referral [phone]- [phone]  Emergency Department Resource Guide 1) Find a Doctor and Pay Out of Pocket Although you won't have to find out who is covered by your insurance plan, it is a good idea to ask around and get recommendations. You will then need to call the office and see if the doctor you have chosen will accept you as a new patient and what types of options they offer for patients who are self-pay. Some doctors offer discounts or will set up payment plans for their patients who do not have insurance, but you will need to ask so you aren't surprised when you get to your appointment.  2) Contact Your Local Health Department Not all health departments have doctors that can see patients for sick visits, but many do, so it is worth a call to see if yours does. If you don't know where  your local health department is, you can check in your phone book. The CDC also has a tool to help you locate your state's health department, and many state websites also have listings of all of their local health departments.  3) Find a Walk-in Clinic If your illness is not likely to be very severe or complicated, you may want to try a walk in clinic. These are popping up all over the country in pharmacies, drugstores, and shopping centers. They're usually staffed by nurse practitioners or physician assistants that have been  trained to treat common illnesses and complaints. They're usually fairly quick and inexpensive. However, if you have serious medical issues or chronic medical problems, these are probably not your best option.  No Primary Care Doctor: Call Health Connect at  [phone] - they can help you locate a primary care doctor that  accepts your insurance, provides certain services, etc. Physician Referral Service- [phone]  Chronic Pain Problems: Organization         Address  Phone   Notes  [facility]  [phone] Patients need to be referred by their primary care doctor.   Medication Assistance: Organization         Address  Phone   Notes  [facility] Medication [facility] [address] [place] [place], [place] [address] [phone] --Must be a resident of [facility] -- Must have NO insurance coverage whatsoever (no Medicaid/ Medicare, etc.) -- The pt. MUST have a primary care doctor that directs their care regularly and follows them in the community   [facility]  [phone]   [employer]  [phone]    Agencies that provide inexpensive medical care: Organization         Address  Phone   Notes  [facility]  [phone]   [facility]    [phone]   [facility] [address] [place], [place] [address] [phone]   [facility] [address] [place] [place] [phone]   [facility]    [phone]   [facility]    [phone]   [facility]  [address], [place] Phone:  [phone], Fax:  [phone] Hours of Operation:  9 am - 6 pm, M-F.  Also accepts Medicaid/Medicare and self-pay.  [facility]  [address], [place] Phone: [phone], Fax: [phone]. Hours of Operation:  8:30 am - 5:30 pm, M-F.  Also accepts Medicaid and self-pay.   [facility] [place] [address] [place] Phone: [phone]   [facility] [address] Natasha Bence [place], [place] [phone], Ext. [phone] Mondays & Thursdays: 7-9 AM.  First 15 patients are seen on a first come, first serve basis.    Medicaid-accepting [facility] Providers:  Organization         Address  Phone   Notes  [facility] [address], [place] [phone] Also accepts self-pay patients.  [facility] [address] Laurell Josephs [place], [place]  [phone]   [facility] [address], [place] [phone]   [facility] [address] [place] [phone]   Renaye Rakers [address], [place]   [phone] Only accepts [place] patients after they  have their name applied to their card.   Self-Pay (no insurance) in [facility]:  Organization         Address  Phone   Notes  Sickle Cell Patients, [facility] [address] [place], [place] [phone]   [facility] [address] [place], [place] [phone]   [facility] [place]  [address], [place] [phone]   [facility]/Dr. Osei-Bonsu  [address], [place] or [address], [place] [phone] Phone number for both [place] and [place] locations is the same.  [facility] [address], [place] [phone]   [facility] [address], [place] or [phone]   [facility] [address], [place] [phone], phone; [phone], fax Sees patients 1st and 3rd Saturday of every month.  Must not qualify for public or private insurance (i.e. Medicaid, Medicare, [place] Health Choice, Veterans' Benefits)  Household income should be no  more than 200% of the poverty level The clinic cannot treat you if you are pregnant or think you are pregnant  Sexually transmitted diseases are not treated at the clinic.

## 2019-02-01 NOTE — ED Notes (Signed)
Pt aware he is staying for 3 hour troponins

## 2019-02-02 LAB — HEPATITIS PANEL, ACUTE
HCV Ab: <0.1 s/co ratio (ref 0.0–0.9)
Hep A IgM: NEGATIVE
Hep B C IgM: NEGATIVE
Hepatitis B Surface Ag: NEGATIVE

## 2020-02-13 ENCOUNTER — Other Ambulatory Visit: Payer: Self-pay

## 2020-02-13 ENCOUNTER — Encounter (HOSPITAL_COMMUNITY): Payer: Self-pay | Admitting: Emergency Medicine

## 2020-02-13 ENCOUNTER — Emergency Department (HOSPITAL_COMMUNITY)

## 2020-02-13 ENCOUNTER — Emergency Department (HOSPITAL_COMMUNITY)
Admission: EM | Admit: 2020-02-13 | Discharge: 2020-02-13 | Disposition: A | Discharge status: Home / Self Care | Attending: Emergency Medicine | Admitting: Emergency Medicine

## 2020-02-13 DIAGNOSIS — Y92410 Unspecified street and highway as the place of occurrence of the external cause: Secondary | ICD-10-CM | POA: Insufficient documentation

## 2020-02-13 DIAGNOSIS — Y999 Unspecified external cause status: Secondary | ICD-10-CM | POA: Insufficient documentation

## 2020-02-13 DIAGNOSIS — Y9389 Activity, other specified: Secondary | ICD-10-CM | POA: Insufficient documentation

## 2020-02-13 DIAGNOSIS — Z79899 Other long term (current) drug therapy: Secondary | ICD-10-CM | POA: Insufficient documentation

## 2020-02-13 DIAGNOSIS — S6991XA Unspecified injury of right wrist, hand and finger(s), initial encounter: Secondary | ICD-10-CM | POA: Diagnosis present

## 2020-02-13 MED ORDER — IBUPROFEN 600 MG PO TABS: 600.0000 mg | ORAL_TABLET | Freq: Four times a day (QID) | ORAL | 0 refills | Status: DC | PRN

## 2020-02-13 MED ORDER — HYDROCODONE-ACETAMINOPHEN 5-325 MG PO TABS
1.0000 | ORAL_TABLET | Freq: Once | ORAL | Status: AC
  Administered 2020-02-13: 1 via ORAL
  Filled 2020-02-13: qty 1

## 2020-02-13 MED ORDER — IBUPROFEN 400 MG PO TABS
600.0000 mg | ORAL_TABLET | Freq: Once | ORAL | Status: AC
  Administered 2020-02-13: 600 mg via ORAL
  Filled 2020-02-13: qty 2

## 2020-02-13 MED ORDER — HYDROCODONE-ACETAMINOPHEN 5-325 MG PO TABS: 1.0000 | ORAL_TABLET | Freq: Four times a day (QID) | ORAL | 0 refills | Status: DC | PRN
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
## 2020-02-13 NOTE — Discharge Instructions (Addendum)
You have been seen today for a wrist injury. There were no acute abnormalities on the x-rays, including no sign of fracture or dislocation, however, there could be injuries to the soft tissues, such as the ligaments or tendons that are not seen on xrays. There could also be what are called occult fractures that are small fractures not seen on xray. Antiinflammatory medications: Take 600 mg of ibuprofen every 6 hours or 440 mg (over the counter dose) to 500 mg (prescription dose) of naproxen every 12 hours for the next 3 days. After this time, these medications may be used as needed for pain. Take these medications with food to avoid upset stomach. Choose only one of these medications, do not take them together. Acetaminophen (generic for Tylenol): Should you continue to have additional pain while taking the ibuprofen or naproxen, you may add in acetaminophen as needed. Your daily total maximum amount of acetaminophen from all sources should be limited to 4000mg /day for persons without liver problems, or 2000mg /day for those with liver problems. Ice: May apply ice to the area over the next 24 hours for 15 minutes at a time to reduce swelling. Elevation: Keep the extremity elevated as often as possible to reduce pain and inflammation. Support: Wear the splint for support and comfort. Wear this until pain resolves.  Follow up: If symptoms are improving, you may follow up with your primary care provider for any continued management. If symptoms are not starting to improve within a week, you should follow up with the orthopedic specialist within two weeks. Return: Return to the ED for numbness, weakness, increasing pain, overall worsening symptoms, loss of function, or if symptoms are not improving, you have tried to follow up with the orthopedic specialist, and have been unable to do so.  For prescription assistance, may try using prescription discount sites or apps, such as goodrx.com

## 2020-06-10 ENCOUNTER — Encounter (HOSPITAL_COMMUNITY): Payer: Self-pay | Admitting: Emergency Medicine

## 2020-06-10 ENCOUNTER — Other Ambulatory Visit: Payer: Self-pay

## 2020-06-10 ENCOUNTER — Emergency Department (HOSPITAL_COMMUNITY)

## 2020-06-10 ENCOUNTER — Emergency Department (HOSPITAL_COMMUNITY)
Admission: EM | Admit: 2020-06-10 | Discharge: 2020-06-10 | Disposition: A | Discharge status: Home / Self Care | Attending: Emergency Medicine | Admitting: Emergency Medicine

## 2020-06-10 DIAGNOSIS — Y999 Unspecified external cause status: Secondary | ICD-10-CM | POA: Insufficient documentation

## 2020-06-10 DIAGNOSIS — Y9301 Activity, walking, marching and hiking: Secondary | ICD-10-CM | POA: Insufficient documentation

## 2020-06-10 DIAGNOSIS — S62361A Nondisplaced fracture of neck of second metacarpal bone, left hand, initial encounter for closed fracture: Secondary | ICD-10-CM | POA: Diagnosis not present

## 2020-06-10 DIAGNOSIS — Y92007 Garden or yard of unspecified non-institutional (private) residence as the place of occurrence of the external cause: Secondary | ICD-10-CM | POA: Insufficient documentation

## 2020-06-10 DIAGNOSIS — S6992XA Unspecified injury of left wrist, hand and finger(s), initial encounter: Secondary | ICD-10-CM | POA: Diagnosis present

## 2020-06-10 DIAGNOSIS — S62327A Displaced fracture of shaft of fifth metacarpal bone, left hand, initial encounter for closed fracture: Secondary | ICD-10-CM

## 2020-06-10 DIAGNOSIS — W208XXA Other cause of strike by thrown, projected or falling object, initial encounter: Secondary | ICD-10-CM | POA: Insufficient documentation

## 2020-06-10 MED ORDER — HYDROCODONE-ACETAMINOPHEN 5-325 MG PO TABS
1.0000 | ORAL_TABLET | Freq: Once | ORAL | Status: AC
  Administered 2020-06-10: 1 via ORAL
  Filled 2020-06-10: qty 1

## 2020-06-10 MED ORDER — IBUPROFEN 400 MG PO TABS
600.0000 mg | ORAL_TABLET | Freq: Once | ORAL | Status: AC
  Administered 2020-06-10: 600 mg via ORAL
  Filled 2020-06-10: qty 2

## 2020-06-10 MED ORDER — HYDROCODONE-ACETAMINOPHEN 5-325 MG PO TABS: 1.0000 | ORAL_TABLET | Freq: Four times a day (QID) | ORAL | 0 refills | Status: DC | PRN

## 2020-06-10 NOTE — Discharge Instructions (Addendum)
Elevate your hand, use ice packs for pain and to get the swelling down.  Take the pain medication written with ibuprofen 600 mg every 6 hours as needed for pain.  Wear the splint until you are rechecked by the orthopedist, Dr. Romeo Apple.  Please call his office to get an appointment.  You have a "mid to distal shaft spiral fracture of the metacarpal of the fifth finger with minimal displacement".

## 2020-06-10 NOTE — ED Provider Notes (Signed)
[facility] EMERGENCY DEPARTMENT Provider Note   CSN: [id number] Arrival date & time: 06/10/20  2230   Time seen 11:15 PM  History Chief Complaint  Patient presents with  . Hand Injury    [name] is a 39 y.o. male.  HPI   Patient who is right-handed states about 8 PM tonight he was walking in the yard and stepped in a dip and started to fall and he caught himself with his left little finger on a car that was parked beside him.  He states his little finger was abducted and he felt a pop in his hand.  Since then he has pain when he moves his little finger and it hurts in the radial aspect of his hand.  He denies actually falling or any other injury.  PCP Patient, No Pcp Per   Past Medical History:  Diagnosis Date  . Liver laceration   . Pneumothorax   . Restless leg syndrome   . Spleen laceration   . Traumatic brain injury (HCC)     There are no problems to display for this patient.   Past Surgical History:  Procedure Laterality Date  . CLAVICLE SURGERY    . LUNG REMOVAL, PARTIAL Right   . PLEURAL SCARIFICATION         No family history on file.  Social History   Tobacco Use  . Smoking status: Never Smoker  . Smokeless tobacco: Never Used  Substance Use Topics  . Alcohol use: No  . Drug use: No    Home Medications Prior to Admission medications   Medication Sig Start Date End Date Taking? Authorizing Provider  gabapentin (NEURONTIN) 300 MG capsule Take 300 mg by mouth daily.     [provider]  HYDROcodone-acetaminophen (NORCO/VICODIN) 5-325 MG tablet Take 1 tablet by mouth every 6 (six) hours as needed for moderate pain or severe pain. 06/10/20   Devoria Albe, MD  ibuprofen (ADVIL) 600 MG tablet Take 1 tablet (600 mg total) by mouth every 6 (six) hours as needed. 02/13/20   Joy, Shawn C, PA-C    Allergies    Amoxicillin  Review of Systems   Review of Systems  All other systems reviewed and are negative.   Physical Exam Updated Vital  Signs Pulse 88   Temp 98.2 F (36.8 C) (Oral)   Resp 16   Wt 90.7 kg   SpO2 98%   BMI 30.40 kg/m   Physical Exam Vitals and nursing note reviewed.  Constitutional:      Appearance: Normal appearance. He is normal weight.  HENT:     Head: Normocephalic and atraumatic.     Right Ear: External ear normal.     Left Ear: External ear normal.  Eyes:     Extraocular Movements: Extraocular movements intact.     Conjunctiva/sclera: Conjunctivae normal.  Cardiovascular:     Rate and Rhythm: Normal rate.  Pulmonary:     Effort: Pulmonary effort is normal. No respiratory distress.  Musculoskeletal:        General: Swelling and tenderness present.     Cervical back: Normal range of motion.     Comments: Patient is noted to have some mild swelling over the radial aspect of his left hand.  He has no pain to palpation in his wrist, he has no pain on supination and pronation or dorsiflexion and extension.  He is nontender to palpation over the metacarpals of his thumb and second through fourth fingers.  He has  some tenderness over the metacarpal of the little finger.  He also complains of pain when he tries to do range of motion of his little finger.  He has good distal pulses and capillary refill.  Skin:    General: Skin is warm and dry.  Neurological:     General: No focal deficit present.     Mental Status: He is alert and oriented to person, place, and time.     Cranial Nerves: No cranial nerve deficit.  Psychiatric:        Mood and Affect: Mood normal.        Behavior: Behavior normal.        Thought Content: Thought content normal.     ED Results / Procedures / Treatments   Labs (all labs ordered are listed, but only abnormal results are displayed) Labs Reviewed - No data to display  EKG None  Radiology DG Hand Complete Left  Result Date: 06/10/2020 CLINICAL DATA:  Fall with injury EXAM: LEFT HAND - COMPLETE 3+ VIEW COMPARISON:  None. FINDINGS: Acute fracture involving the  mid and distal shaft of the fifth metacarpal with trace dorsal displacement of distal fracture fragment. No significant angulation. No subluxation. IMPRESSION: Acute minimally displaced fracture involving the fifth metacarpal Electronically Signed   By: Jasmine Pang M.D.   On: 06/10/2020 23:08    Procedures Procedures (including critical care time)  Medications Ordered in ED Medications  ibuprofen (ADVIL) tablet 600 mg (has no administration in time range)  HYDROcodone-acetaminophen (NORCO/VICODIN) 5-325 MG per tablet 1 tablet (has no administration in time range)    ED Course  I have reviewed the triage vital signs and the nursing notes.  Pertinent labs & imaging results that were available during my care of the patient were reviewed by me and considered in my medical decision making (see chart for details).    MDM Rules/Calculators/A&P                          Patient expresses that he wants to see a local orthopedist.  He was referred to Dr. Romeo Apple.  He was given an ice pack and placed in a ulnar gutter splint.  He was given a copy of his x-rays and we discussed possible future treatment such as casting and/or pinning depending on if the fragments become displaced.    Final Clinical Impression(s) / ED Diagnoses Final diagnoses:  Closed displaced fracture of shaft of fifth metacarpal bone of left hand, initial encounter    Rx / DC Orders ED Discharge Orders         Ordered    HYDROcodone-acetaminophen (NORCO/VICODIN) 5-325 MG tablet  Every 6 hours PRN     Discontinue  Reprint     06/10/20 2325        OTC ibuprofen   Plan discharge  Devoria Albe, MD, Concha Pyo, MD 06/10/20 [phone]

## 2020-06-10 NOTE — ED Triage Notes (Signed)
Pt C/O left hand pain after falling in a hole tonight and "bending my pinky back."

## 2020-06-12 ENCOUNTER — Telehealth (HOSPITAL_COMMUNITY): Payer: Self-pay | Admitting: Emergency Medicine

## 2020-06-12 MED ORDER — HYDROCODONE-ACETAMINOPHEN 5-325 MG PO TABS: ORAL_TABLET | ORAL | 0 refills | Status: DC

## 2020-06-12 NOTE — Telephone Encounter (Cosign Needed)
Pt called and requested that previous Vicodin prescription be changed to a different pharmacy. Original pharmacy contacted and initial prescription cancelled.

## 2022-11-28 ENCOUNTER — Ambulatory Visit: Admitting: Family Medicine

## 2024-04-19 ENCOUNTER — Emergency Department (HOSPITAL_COMMUNITY)
Admission: EM | Admit: 2024-04-19 | Discharge: 2024-04-19 | Disposition: A | Discharge status: Home / Self Care | Attending: Emergency Medicine | Admitting: Emergency Medicine

## 2024-04-19 ENCOUNTER — Encounter (HOSPITAL_COMMUNITY): Payer: Self-pay

## 2024-04-19 ENCOUNTER — Other Ambulatory Visit: Payer: Self-pay

## 2024-04-19 DIAGNOSIS — B349 Viral infection, unspecified: Secondary | ICD-10-CM | POA: Insufficient documentation

## 2024-04-19 DIAGNOSIS — R52 Pain, unspecified: Secondary | ICD-10-CM | POA: Insufficient documentation

## 2024-04-19 DIAGNOSIS — R509 Fever, unspecified: Secondary | ICD-10-CM | POA: Diagnosis present

## 2024-04-19 LAB — CBC WITH DIFFERENTIAL/PLATELET
Abs Immature Granulocytes: 0.05 10*3/uL (ref 0.00–0.07)
Basophils Absolute: 0.1 10*3/uL (ref 0.0–0.1)
Basophils Relative: 1 %
Eosinophils Absolute: 0 10*3/uL (ref 0.0–0.5)
Eosinophils Relative: 0 %
HCT: 48.9 % (ref 39.0–52.0)
Hemoglobin: 16.4 g/dL (ref 13.0–17.0)
Immature Granulocytes: 0 %
Lymphocytes Relative: 7 %
Lymphs Abs: 1 10*3/uL (ref 0.7–4.0)
MCH: 28 pg (ref 26.0–34.0)
MCHC: 33.5 g/dL (ref 30.0–36.0)
MCV: 83.6 fL (ref 80.0–100.0)
Monocytes Absolute: 0.7 10*3/uL (ref 0.1–1.0)
Monocytes Relative: 5 %
Neutro Abs: 13.1 10*3/uL — ABNORMAL HIGH (ref 1.7–7.7)
Neutrophils Relative %: 87 %
Platelets: 162 10*3/uL (ref 150–400)
RBC: 5.85 MIL/uL — ABNORMAL HIGH (ref 4.22–5.81)
RDW: 13.1 % (ref 11.5–15.5)
WBC: 14.9 10*3/uL — ABNORMAL HIGH (ref 4.0–10.5)
nRBC: 0 % (ref 0.0–0.2)

## 2024-04-19 LAB — COMPREHENSIVE METABOLIC PANEL WITH GFR
ALT: 17 U/L (ref 0–44)
AST: 17 U/L (ref 15–41)
Albumin: 4.2 g/dL (ref 3.5–5.0)
Alkaline Phosphatase: 43 U/L (ref 38–126)
Anion gap: 9 (ref 5–15)
BUN: 11 mg/dL (ref 6–20)
CO2: 24 mmol/L (ref 22–32)
Calcium: 9.3 mg/dL (ref 8.9–10.3)
Chloride: 104 mmol/L (ref 98–111)
Creatinine, Ser: 1.05 mg/dL (ref 0.61–1.24)
GFR, Estimated: >60 mL/min (ref >60)
Glucose, Bld: 106 mg/dL — ABNORMAL HIGH (ref 70–99)
Potassium: 4.2 mmol/L (ref 3.5–5.1)
Sodium: 137 mmol/L (ref 135–145)
Total Bilirubin: 2.5 mg/dL — ABNORMAL HIGH (ref 0.0–1.2)
Total Protein: 7.4 g/dL (ref 6.5–8.1)

## 2024-04-19 LAB — URINALYSIS, ROUTINE W REFLEX MICROSCOPIC
Bilirubin Urine: NEGATIVE
Glucose, UA: NEGATIVE mg/dL
Hgb urine dipstick: NEGATIVE
Ketones, ur: NEGATIVE mg/dL
Leukocytes,Ua: NEGATIVE
Nitrite: NEGATIVE
Protein, ur: NEGATIVE mg/dL
Specific Gravity, Urine: 1.021 (ref 1.005–1.030)
pH: 5 (ref 5.0–8.0)

## 2024-04-19 LAB — RESP PANEL BY RT-PCR (RSV, FLU A&B, COVID)  RVPGX2
Influenza A by PCR: NEGATIVE
Influenza B by PCR: NEGATIVE
Resp Syncytial Virus by PCR: NEGATIVE
SARS Coronavirus 2 by RT PCR: NEGATIVE

## 2024-04-19 MED ORDER — KETOROLAC TROMETHAMINE 15 MG/ML IJ SOLN: 15.0000 mg | Freq: Once | INTRAMUSCULAR | Status: DC

## 2024-04-19 MED ORDER — KETOROLAC TROMETHAMINE 15 MG/ML IJ SOLN
15.0000 mg | Freq: Once | INTRAMUSCULAR | Status: AC
  Administered 2024-04-19: 15 mg via INTRAVENOUS
  Filled 2024-04-19: qty 1

## 2024-04-19 MED ORDER — DOXYCYCLINE HYCLATE 100 MG PO CAPS: 100.0000 mg | ORAL_CAPSULE | Freq: Two times a day (BID) | ORAL | 0 refills | Status: AC

## 2024-04-19 MED ORDER — DIAZEPAM 5 MG/ML IJ SOLN
2.5000 mg | Freq: Once | INTRAMUSCULAR | Status: AC
  Administered 2024-04-19: 2.5 mg via INTRAVENOUS
  Filled 2024-04-19 (×2): qty 2

## 2024-04-19 MED ORDER — ACETAMINOPHEN 500 MG PO TABS
1000.0000 mg | ORAL_TABLET | Freq: Once | ORAL | Status: AC
  Administered 2024-04-19: 1000 mg via ORAL
  Filled 2024-04-19: qty 2

## 2024-04-19 MED ORDER — SODIUM CHLORIDE 0.9 % IV BOLUS
1000.0000 mL | Freq: Once | INTRAVENOUS | Status: AC
  Administered 2024-04-19: 1000 mL via INTRAVENOUS

## 2024-04-19 NOTE — ED Triage Notes (Signed)
 Pt reports with fever and generalized body aches since last night. Pt reports more pain in his lower back and is unable to urinate as usual. Pt reports having prostate issues and reports being exposed to ticks and other insects with his job.

## 2024-04-19 NOTE — ED Provider Notes (Signed)
 [place] EMERGENCY DEPARTMENT AT [facility] Provider Note   CSN: [id number] Arrival date & time: 04/19/24  4098     History  Chief Complaint  Patient presents with   Fever   Generalized Body Aches    DETWAN WANT is a 43 y.o. male.  43 year old male with prior medical history as detailed below presents for evaluation.  Patient complains of generalized malaise and fatigue.  Symptoms began last night.  He also reports subjective fever.  Patient reports multiple tick bites.  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  He is concerned about possible tickborne illness.  He denies rash.  He denies headache.  He denies neck pain.  He reports a history of having [place] spotted fever when he was 7.  The history is provided by the patient.       Home Medications Prior to Admission medications   Medication Sig Start Date End Date Taking? Authorizing Provider  gabapentin (NEURONTIN) 300 MG capsule Take 300 mg by mouth daily.     [provider]  HYDROcodone -acetaminophen  (NORCO/VICODIN) 5-325 MG tablet Take 1 tablet by mouth every 6 (six) hours as needed for moderate pain or severe pain. 06/10/20   Knapp, Iva, MD  HYDROcodone -acetaminophen  (NORCO/VICODIN) 5-325 MG tablet Take one tab po q 4 hrs prn pain 06/12/20   Triplett, Tammy, PA-C  ibuprofen  (ADVIL ) 600 MG tablet Take 1 tablet (600 mg total) by mouth every 6 (six) hours as needed. 02/13/20   Joy, Shawn C, PA-C      Allergies    Amoxicillin    Review of Systems   Review of Systems  All other systems reviewed and are negative.   Physical Exam Updated Vital Signs BP (!) 137/92 (BP Location: Left Arm)   Pulse (!) 119   Temp 98.3 F (36.8 C) (Oral)   Resp 18   Ht 5\' 8"  (1.727 m)   Wt 90.7 kg   SpO2 100%   BMI 30.40 kg/m  Physical Exam Vitals and nursing note reviewed.  Constitutional:      General: He is not in acute distress.    Appearance: Normal appearance. He is  well-developed.  HENT:     Head: Normocephalic and atraumatic.  Eyes:     Conjunctiva/sclera: Conjunctivae normal.     Pupils: Pupils are equal, round, and reactive to light.  Cardiovascular:     Rate and Rhythm: Normal rate and regular rhythm.     Heart sounds: Normal heart sounds.  Pulmonary:     Effort: Pulmonary effort is normal. No respiratory distress.     Breath sounds: Normal breath sounds.  Abdominal:     General: There is no distension.     Palpations: Abdomen is soft.     Tenderness: There is no abdominal tenderness.  Musculoskeletal:        General: No deformity. Normal range of motion.     Cervical back: Normal range of motion and neck supple.  Skin:    General: Skin is warm and dry.  Neurological:     General: No focal deficit present.     Mental Status: He is alert and oriented to person, place, and time. Mental status is at baseline.     ED Results / Procedures / Treatments   Labs (all labs ordered are listed, but only abnormal results are displayed) Labs Reviewed  RESP PANEL BY RT-PCR (RSV, FLU A&B, COVID)  [id number]  URINALYSIS, ROUTINE W  REFLEX MICROSCOPIC  COMPREHENSIVE METABOLIC PANEL WITH GFR  CBC WITH DIFFERENTIAL/PLATELET    EKG None  Radiology No results found.  Procedures Procedures    Medications Ordered in ED Medications  sodium chloride  0.9 % bolus 1,000 mL (has no administration in time range)  diazepam (VALIUM) injection 2.5 mg (has no administration in time range)    ED Course/ Medical Decision Making/ A&P                                 Medical Decision Making Amount and/or Complexity of Data Reviewed Labs: ordered.  Risk OTC drugs. Prescription drug management.    Medical Screen Complete  This patient presented to the ED with complaint of fatigue malaise, fever  This complaint involves an extensive number of treatment options. The initial differential diagnosis includes, but is not limited to, viral syndrome,  metabolic abnormality, tickborne illness.  This presentation is: Acute, Self-Limited, Previously Undiagnosed, Uncertain Prognosis, Complicated, and Systemic Symptoms  Patient presents with complaint of approximate 12 hours of malaise, fatigue, subjective fever.  Describe symptoms are most consistent with likely viral syndrome.    Screening labs obtained are without significant abnormality.  Patient is reporting multiple tick bites in the course of his work.  Patient is not reporting rash or other associated issue with his tick bites.  He is concerned about the possibility of tickborne illness.  Patient is interested in a course of doxycycline  as prophylaxis.  Importance of close follow-up stressed.  Strict return precautions given and understood. Additional history obtained:  External records from outside sources obtained and reviewed including prior ED visits and prior Inpatient records.    Problem List / ED Course:  Viral syndrome   Reevaluation:  After the interventions noted above, I reevaluated the patient and found that they have: improved  Disposition:  After consideration of the diagnostic results and the patients response to treatment, I feel that the patent would benefit from close outpatient followup.          Final Clinical Impression(s) / ED Diagnoses Final diagnoses:  Viral syndrome    Rx / DC Orders ED Discharge Orders     None         Burnette Carte, MD 04/19/24 1116

## 2024-04-19 NOTE — Discharge Instructions (Addendum)
 Return for any problem.  ?

## 2024-04-21 ENCOUNTER — Other Ambulatory Visit: Payer: Self-pay

## 2024-04-21 ENCOUNTER — Encounter (HOSPITAL_COMMUNITY): Payer: Self-pay

## 2024-04-21 ENCOUNTER — Emergency Department (HOSPITAL_COMMUNITY)

## 2024-04-21 ENCOUNTER — Emergency Department (HOSPITAL_COMMUNITY)
Admission: EM | Admit: 2024-04-21 | Discharge: 2024-04-21 | Disposition: A | Discharge status: Home / Self Care | Attending: Emergency Medicine | Admitting: Emergency Medicine

## 2024-04-21 DIAGNOSIS — R509 Fever, unspecified: Secondary | ICD-10-CM | POA: Diagnosis present

## 2024-04-21 DIAGNOSIS — B349 Viral infection, unspecified: Secondary | ICD-10-CM | POA: Insufficient documentation

## 2024-04-21 DIAGNOSIS — R590 Localized enlarged lymph nodes: Secondary | ICD-10-CM | POA: Insufficient documentation

## 2024-04-21 LAB — COMPREHENSIVE METABOLIC PANEL WITH GFR
ALT: 104 U/L — ABNORMAL HIGH (ref 0–44)
AST: 74 U/L — ABNORMAL HIGH (ref 15–41)
Albumin: 3.3 g/dL — ABNORMAL LOW (ref 3.5–5.0)
Alkaline Phosphatase: 53 U/L (ref 38–126)
Anion gap: 12 (ref 5–15)
BUN: 9 mg/dL (ref 6–20)
CO2: 24 mmol/L (ref 22–32)
Calcium: 8.5 mg/dL — ABNORMAL LOW (ref 8.9–10.3)
Chloride: 99 mmol/L (ref 98–111)
Creatinine, Ser: 1.12 mg/dL (ref 0.61–1.24)
GFR, Estimated: >60 mL/min (ref >60)
Glucose, Bld: 106 mg/dL — ABNORMAL HIGH (ref 70–99)
Potassium: 3.9 mmol/L (ref 3.5–5.1)
Sodium: 135 mmol/L (ref 135–145)
Total Bilirubin: 1.7 mg/dL — ABNORMAL HIGH (ref 0.0–1.2)
Total Protein: 6.9 g/dL (ref 6.5–8.1)

## 2024-04-21 LAB — RESP PANEL BY RT-PCR (RSV, FLU A&B, COVID)  RVPGX2
Influenza A by PCR: NEGATIVE
Influenza B by PCR: NEGATIVE
Resp Syncytial Virus by PCR: NEGATIVE
SARS Coronavirus 2 by RT PCR: NEGATIVE

## 2024-04-21 LAB — URINALYSIS, W/ REFLEX TO CULTURE (INFECTION SUSPECTED)
Bilirubin Urine: NEGATIVE
Glucose, UA: NEGATIVE mg/dL
Hgb urine dipstick: NEGATIVE
Ketones, ur: 5 mg/dL — AB
Leukocytes,Ua: NEGATIVE
Nitrite: NEGATIVE
Protein, ur: 100 mg/dL — AB
Specific Gravity, Urine: 1.018 (ref 1.005–1.030)
pH: 6 (ref 5.0–8.0)

## 2024-04-21 LAB — CBC WITH DIFFERENTIAL/PLATELET
Abs Immature Granulocytes: 0.03 10*3/uL (ref 0.00–0.07)
Basophils Absolute: 0 10*3/uL (ref 0.0–0.1)
Basophils Relative: 0 %
Eosinophils Absolute: 0 10*3/uL (ref 0.0–0.5)
Eosinophils Relative: 0 %
HCT: 47 % (ref 39.0–52.0)
Hemoglobin: 16.3 g/dL (ref 13.0–17.0)
Immature Granulocytes: 0 %
Lymphocytes Relative: 11 %
Lymphs Abs: 0.9 10*3/uL (ref 0.7–4.0)
MCH: 28.2 pg (ref 26.0–34.0)
MCHC: 34.7 g/dL (ref 30.0–36.0)
MCV: 81.3 fL (ref 80.0–100.0)
Monocytes Absolute: 0.2 10*3/uL (ref 0.1–1.0)
Monocytes Relative: 2 %
Neutro Abs: 7.1 10*3/uL (ref 1.7–7.7)
Neutrophils Relative %: 87 %
Platelets: 104 10*3/uL — ABNORMAL LOW (ref 150–400)
RBC: 5.78 MIL/uL (ref 4.22–5.81)
RDW: 13.3 % (ref 11.5–15.5)
WBC: 8.2 10*3/uL (ref 4.0–10.5)
nRBC: 0 % (ref 0.0–0.2)

## 2024-04-21 LAB — LACTIC ACID, PLASMA
Lactic Acid, Venous: 1.4 mmol/L (ref 0.5–1.9)
Lactic Acid, Venous: 0.9 mmol/L (ref 0.5–1.9)

## 2024-04-21 LAB — TROPONIN I (HIGH SENSITIVITY)
Troponin I (High Sensitivity): 4 ng/L (ref <18)
Troponin I (High Sensitivity): 4 ng/L (ref <18)

## 2024-04-21 MED ORDER — ONDANSETRON HCL 4 MG PO TABS: 4.0000 mg | ORAL_TABLET | Freq: Four times a day (QID) | ORAL | 0 refills | Status: AC

## 2024-04-21 MED ORDER — SODIUM CHLORIDE 0.9 % IV BOLUS
1000.0000 mL | Freq: Once | INTRAVENOUS | Status: AC
  Administered 2024-04-21: 1000 mL via INTRAVENOUS

## 2024-04-21 MED ORDER — IBUPROFEN 800 MG PO TABS: 800.0000 mg | ORAL_TABLET | Freq: Three times a day (TID) | ORAL | 0 refills | Status: AC

## 2024-04-21 MED ORDER — ACETAMINOPHEN 500 MG PO TABS
1000.0000 mg | ORAL_TABLET | Freq: Once | ORAL | Status: AC
  Administered 2024-04-21: 1000 mg via ORAL
  Filled 2024-04-21: qty 2

## 2024-04-21 NOTE — ED Triage Notes (Signed)
 Pt arrived via POV c/o fever, fatigue, neck pain and reports new onset of a rash on the top of his forehead. Pt endorses decreased oral intake.

## 2024-04-21 NOTE — ED Provider Notes (Signed)
 [place] EMERGENCY DEPARTMENT AT [facility] Provider Note   CSN: [id number] Arrival date & time: 04/21/24  1134     History  Chief Complaint  Patient presents with   Fever    [name] is a 43 y.o. male.   Fever Associated symptoms: chest pain and rash   Associated symptoms: no chills, no congestion, no cough, no diarrhea, no dysuria, no headaches, no nausea, no sore throat and no vomiting        [name] is a 43 y.o. male with past medical history of TBI who presents to the Emergency Department complaining of fatigue, intermittent fevers, involuntary twitching, and chest pressure.  Symptoms present since Sunday evening.  [facility] was seen at [facility] on Sunday for generalized bodyaches and fever.  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  After his previous ER visit states his symptoms worsened.  He has taken 1 dose of his doxycycline  last evening, but having the intermittent fevers fatigue and twitching chest pressure prior to starting the doxycycline .  He also endorses having enlarged cervical lymph nodes and tenderness along his scalp with a blisterlike rash along his forehead.  He denies any joint pains or other rashes.  Denies sore throat, dizziness headache or neck stiffness.  Of note, he was out of the country in [place] 1 month ago, no other new medications or other recent travel, denies symptoms while there.   Home Medications Prior to Admission medications   Medication Sig Start Date End Date Taking? Authorizing Provider  doxycycline  (VIBRAMYCIN ) 100 MG capsule Take 1 capsule (100 mg total) by mouth 2 (two) times daily. 04/19/24   Preston Carte, MD  gabapentin (NEURONTIN) 300 MG capsule Take 300 mg by mouth daily.     [provider]  HYDROcodone -acetaminophen  (NORCO/VICODIN) 5-325 MG tablet Take 1 tablet by mouth every 6 (six) hours as needed for moderate pain  or severe pain. 06/10/20   Knapp, Iva, MD  HYDROcodone -acetaminophen  (NORCO/VICODIN) 5-325 MG tablet Take one tab po q 4 hrs prn pain 06/12/20   Preston Colligan, PA-C  ibuprofen  (ADVIL ) 600 MG tablet Take 1 tablet (600 mg total) by mouth every 6 (six) hours as needed. 02/13/20   Joy, Shawn C, PA-C      Allergies    Amoxicillin    Review of Systems   Review of Systems  Constitutional:  Positive for appetite change and fever. Negative for chills.  HENT:  Negative for congestion, facial swelling, sore throat and trouble swallowing.   Eyes:  Negative for visual disturbance.  Respiratory:  Negative for cough and shortness of breath.   Cardiovascular:  Positive for chest pain.  Gastrointestinal:  Negative for abdominal pain, diarrhea, nausea and vomiting.  Genitourinary:  Negative for dysuria and flank pain.  Skin:  Positive for rash.       Rash forehead  Neurological:  Negative for dizziness, weakness, numbness and headaches.    Physical Exam Updated Vital Signs BP 117/72   Pulse 98   Temp 100 F (37.8 C) (Oral)   Resp 18   Ht 5\' 8"  (1.727 m)   Wt 90.7 kg   SpO2 96%   BMI 30.40 kg/m  Physical Exam Vitals and nursing note reviewed.  Constitutional:      General: He is not in acute distress.    Appearance: Normal appearance. He is not ill-appearing or toxic-appearing.  HENT:     Mouth/Throat:     Mouth: Mucous membranes are moist.     Pharynx: Oropharynx is clear.  Eyes:     Conjunctiva/sclera: Conjunctivae normal.     Pupils: Pupils are equal, round, and reactive to light.  Neck:     Trachea: Phonation normal.     Meningeal: Kernig's sign absent.  Cardiovascular:     Rate and Rhythm: Normal rate and regular rhythm.     Pulses: Normal pulses.  Pulmonary:     Effort: Pulmonary effort is normal.     Breath sounds: Normal breath sounds.  Abdominal:     Palpations: Abdomen is soft.     Tenderness: There is no abdominal tenderness.  Musculoskeletal:        General: Normal  range of motion.     Cervical back: Normal range of motion. No rigidity.  Lymphadenopathy:     Cervical: Cervical adenopathy present.     Right cervical: Posterior cervical adenopathy present.     Left cervical: Posterior cervical adenopathy present.  Skin:    General: Skin is warm.     Capillary Refill: Capillary refill takes less than 2 seconds.     Comments: Mild erythematous rash to upper forehead.  No blisters or edema  Neurological:     General: No focal deficit present.     Mental Status: He is alert.     GCS: GCS eye subscore is 4. GCS verbal subscore is 5. GCS motor subscore is 6.     Sensory: Sensation is intact. No sensory deficit.     Motor: Motor function is intact. No weakness.     ED Results / Procedures / Treatments   Labs (all labs ordered are listed, but only abnormal results are displayed) Labs Reviewed  CBC WITH DIFFERENTIAL/PLATELET - Abnormal; Notable for the following components:      Result Value   Platelets 104 (*)    All other components within normal limits  COMPREHENSIVE METABOLIC PANEL WITH GFR - Abnormal; Notable for the following components:   Glucose, Bld 106 (*)    Calcium 8.5 (*)    Albumin 3.3 (*)    AST 74 (*)    ALT 104 (*)    Total Bilirubin 1.7 (*)    All other components within normal limits  URINALYSIS, W/ REFLEX TO CULTURE (INFECTION SUSPECTED) - Abnormal; Notable for the following components:   Ketones, ur 5 (*)    Protein, ur 100 (*)    Bacteria, UA RARE (*)    All other components within normal limits  RESP PANEL BY RT-PCR (RSV, FLU A&B, COVID)  RVPGX2  CULTURE, BLOOD (ROUTINE X 2)  CULTURE, BLOOD (ROUTINE X 2)  LACTIC ACID, PLASMA  LACTIC ACID, PLASMA  TROPONIN I (HIGH SENSITIVITY)  TROPONIN I (HIGH SENSITIVITY)    EKG EKG Interpretation Date/Time:  Tuesday Apr 21 2024 12:52:35 EDT Ventricular Rate:  92 PR Interval:  160 QRS Duration:  107 QT Interval:  312 QTC Calculation: 386 R Axis:   37  Text  Interpretation: Sinus rhythm RSR' in V1 or V2, probably normal variant ST elevation suggests acute pericarditis No significant change since prior 2/20 Confirmed by Racheal Buddle [phone]) on 04/21/2024 12:55:22 PM  Radiology DG Chest Portable 1 View Result Date: 04/21/2024 CLINICAL DATA:  Shortness of breath. EXAM: PORTABLE CHEST 1 VIEW COMPARISON:  Chest radiograph dated 02/01/2019. FINDINGS: No focal consolidation, pleural effusion, or pneumothorax. The cardiac silhouette is within limits. No acute osseous pathology. Fixation  plate of the clavicles. IMPRESSION: No active disease. Electronically Signed   By: Angus Bark M.D.   On: 04/21/2024 13:18    Procedures Procedures    Medications Ordered in ED Medications  sodium chloride  0.9 % bolus 1,000 mL (0 mLs Intravenous Stopped 04/21/24 1344)  acetaminophen  (TYLENOL ) tablet 1,000 mg (1,000 mg Oral Given 04/21/24 1338)    ED Course/ Medical Decision Making/ A&P Clinical Course as of 04/21/24 1557  Tue Apr 21, 2024  1220 He is presenting with intermittent fever body aches tremor.  Rash across forehead.  Seen at [facility] few days ago and workup showed elevated white count.  Was then [place] 4 weeks ago, works outside multiple tick bites.  Took 1 dose of doxycycline .  Getting screening labs chest x-ray. [MB]    Clinical Course User Index [MB] Tonya Fredrickson, MD                                 Medical Decision Making Patient seen 2 days ago at [facility] for fever generalized body aches and reported tick bites.  He was prescribed doxycycline , has taken 1 dose of medication so far.  Has developed continued fever shaking tremor is controlled with antipyretics, decreased appetite, chest pressure and tenderness of his neck with cervical lymphadenopathy.  No headache nausea vomiting abdominal pain.  Sore throat.  Patient also reports traveling to [place] 1 month ago.  Patient does not appear septic at this time, although this is  considered.  Low-grade fever here.  He took ibuprofen  earlier this morning.  Concern for pericarditis, meningitis, tick related illness, encephalopathy, malarial disease also considered but felt less likely  Amount and/or Complexity of Data Reviewed Labs: ordered.    Details: Labs no evidence of leukocytosis, troponin unremarkable, respiratory panel negative.  Urinalysis without evidence of infection.  Chemistries, lactic acid pending due to laboratory issues Radiology: ordered.    Details: Chest x-ray without acute cardiopulmonary process ECG/medicine tests: ordered.    Details: EKG shows sinus rhythm, ST elevation suggesting acute pericarditis.  No significant change since 2/20 Discussion of management or test interpretation with external provider(s):  Pt also seen by Dr. Efraim Grange and care plan discussed.   I have consulted Dr. Zelda Hickman with ID and discussed findings.  Felt that symptoms within one week of onset still likely a viral process.  She will be glad to see pt in clinic for f/u in 10 days if not resolving   Risk OTC drugs.           Final Clinical Impression(s) / ED Diagnoses Final diagnoses:  Viral illness    Rx / DC Orders ED Discharge Orders     None         Catherne Clubs, PA-C 04/21/24 1741    Ninetta Basket, MD 04/26/24 [phone]

## 2024-04-21 NOTE — Discharge Instructions (Signed)
 As discussed, please take the doxycycline  as directed until finished.  Continue to alternate Tylenol  and ibuprofen  every 4 and 6 hours respectively for fever and/or bodyaches.  You have been prescribed medication to help with nausea as well.  Please follow-up with your primary care provider for recheck.  I have listed the name of infectious disease provider in [place] that you may follow-up with in a week to 10 days if your symptoms are not improving.

## 2024-04-23 ENCOUNTER — Telehealth: Payer: Self-pay

## 2024-04-23 NOTE — Telephone Encounter (Signed)
 Patient called stating that he has been to the ED 2x. Patient stated that he was told to call and make an appointment with Dr.Singh due to facial swelling and rash. Advised patient I would route to provider to review.    Ryu Cerreta Roann Chestnut, CMA

## 2024-04-23 NOTE — Telephone Encounter (Signed)
 Patient came into the clinic today and demanded to be seen. Per ED note patient is to follow up with ID provider if symptoms doesn't resolve in 1 week. Patient went to the ED 2 days ago on 04/21/2024.  Patient was offered our next available appointment which is June 3rd by front desk staff. Patient then told the front desk staff that he would like for someone to tell him to go to the ED or wait for an appointment. I advised patient he could go to the ED because we don't have availability until June 3rd. Informed patient that he could be put on the cancellation list and the front desk will reach out if we have any sooner openings. Patient wasn't happy and then questioned if Dr.Singh could come lay eyes on him. I stated No the provider is seeing patients. Patient then stated that "wasn't a good look".   As I started to walk off the patient then stated that I could leave my attitude back there. Informed patient I do not have an attitude. He put his hand up and started talking loudly so he couldn't hear what I was saying. I then walked off.

## 2024-04-26 LAB — CULTURE, BLOOD (ROUTINE X 2)
Culture: NO GROWTH
Culture: NO GROWTH
Special Requests: ADEQUATE
Special Requests: ADEQUATE

## 2024-05-12 ENCOUNTER — Inpatient Hospital Stay: Admitting: Infectious Diseases
